# Patient Record
Sex: Male | Born: 1955 | Race: Black or African American | Hispanic: No | Marital: Single | Smoking: Current every day smoker
Health system: Southern US, Community
[De-identification: ages and names within clinical notes are randomized; demographics above are authoritative.]

## PROBLEM LIST (undated history)

## (undated) DIAGNOSIS — I10 Essential (primary) hypertension: Secondary | ICD-10-CM

## (undated) DIAGNOSIS — Z8674 Personal history of sudden cardiac arrest: Secondary | ICD-10-CM

## (undated) DIAGNOSIS — N186 End stage renal disease: Secondary | ICD-10-CM

## (undated) DIAGNOSIS — Z992 Dependence on renal dialysis: Secondary | ICD-10-CM

## (undated) DIAGNOSIS — I509 Heart failure, unspecified: Secondary | ICD-10-CM

## (undated) DIAGNOSIS — I2510 Atherosclerotic heart disease of native coronary artery without angina pectoris: Secondary | ICD-10-CM

## (undated) DIAGNOSIS — N400 Enlarged prostate without lower urinary tract symptoms: Secondary | ICD-10-CM

## (undated) DIAGNOSIS — Z8673 Personal history of transient ischemic attack (TIA), and cerebral infarction without residual deficits: Secondary | ICD-10-CM

## (undated) DIAGNOSIS — I639 Cerebral infarction, unspecified: Secondary | ICD-10-CM

## (undated) DIAGNOSIS — N2581 Secondary hyperparathyroidism of renal origin: Secondary | ICD-10-CM

## (undated) HISTORY — DX: Personal history of transient ischemic attack (TIA), and cerebral infarction without residual deficits: Z86.73

## (undated) HISTORY — DX: Secondary hyperparathyroidism of renal origin: N25.81

## (undated) HISTORY — DX: Atherosclerotic heart disease of native coronary artery without angina pectoris: I25.10

## (undated) HISTORY — DX: Cerebral infarction, unspecified: I63.9

## (undated) HISTORY — DX: Dependence on renal dialysis: N18.6

## (undated) HISTORY — DX: Benign prostatic hyperplasia without lower urinary tract symptoms: N40.0

## (undated) HISTORY — DX: Essential (primary) hypertension: I10

## (undated) HISTORY — DX: Personal history of sudden cardiac arrest: Z86.74

## (undated) HISTORY — DX: Dependence on renal dialysis: Z99.2

## (undated) HISTORY — DX: Heart failure, unspecified: I50.9

---

## 2005-04-25 ENCOUNTER — Other Ambulatory Visit: Payer: Self-pay

## 2006-11-05 ENCOUNTER — Other Ambulatory Visit: Payer: Self-pay

## 2008-02-09 ENCOUNTER — Other Ambulatory Visit: Payer: Self-pay | Admitting: Emergency Medicine

## 2008-08-21 ENCOUNTER — Other Ambulatory Visit: Payer: Self-pay | Admitting: Emergency Medicine

## 2010-01-09 ENCOUNTER — Other Ambulatory Visit: Payer: Self-pay | Admitting: Emergency Medicine

## 2011-03-19 HISTORY — PX: CARDIAC CATHETERIZATION: SHX172

## 2011-05-07 ENCOUNTER — Other Ambulatory Visit: Payer: Self-pay

## 2011-11-26 ENCOUNTER — Other Ambulatory Visit: Payer: Self-pay | Admitting: Internal Medicine

## 2011-11-26 DIAGNOSIS — I469 Cardiac arrest, cause unspecified: Secondary | ICD-10-CM

## 2011-11-27 DIAGNOSIS — I059 Rheumatic mitral valve disease, unspecified: Secondary | ICD-10-CM

## 2011-11-28 DIAGNOSIS — I2510 Atherosclerotic heart disease of native coronary artery without angina pectoris: Secondary | ICD-10-CM

## 2011-12-02 ENCOUNTER — Encounter: Payer: Self-pay | Admitting: Cardiovascular Disease

## 2011-12-04 ENCOUNTER — Encounter: Payer: Self-pay | Admitting: *Deleted

## 2011-12-11 ENCOUNTER — Encounter: Payer: Medicaid Other | Admitting: Cardiovascular Disease

## 2011-12-16 ENCOUNTER — Encounter: Payer: Self-pay | Admitting: Cardiovascular Disease

## 2011-12-19 ENCOUNTER — Other Ambulatory Visit: Payer: Self-pay

## 2011-12-20 ENCOUNTER — Other Ambulatory Visit: Payer: Self-pay | Admitting: Internal Medicine

## 2011-12-20 DIAGNOSIS — R079 Chest pain, unspecified: Secondary | ICD-10-CM

## 2011-12-22 ENCOUNTER — Other Ambulatory Visit: Payer: Self-pay

## 2011-12-23 ENCOUNTER — Telehealth: Payer: Self-pay

## 2011-12-23 NOTE — Telephone Encounter (Signed)
TCM call

## 2011-12-23 NOTE — Telephone Encounter (Signed)
Attempted to reach pt for TCM call #1 No answer Voice mailbox not set up yet Will try again later

## 2011-12-23 NOTE — Telephone Encounter (Signed)
TCM call I called pt to assess how he was feeling since d/c yesterday 12/22/11 He says he feels well despite soreness on bottom of one of his feet.  He denies CP, worsening sob or palpitations. He says his "heart feels fine'. He denies known abrasions or open wounds on bottom of foot. He says he lives at home with his sister and son.  They are both at work right now and unable to speak with me. Steve Ortiz speech is difficult to understand, but he was able to tell me he has only "some" of his meds from discharge.  He is unaware as to which ones he does not have since family places meds in pill box for him. He was unaware of appt scheduled with Dr. Mariah Milling 10/9 at 2:15. He says he is unsure if he can come d/t transportation issues. Says he usually has to use [facility] transportation system and has not scheduled appt yet. I told him I would call for him to have them pick him up for Wednesday's appt. I explained importance of coming to this appt since he may not be on all meds he needs, etc. He verb. Understanding and will come Wednesday 10/9.

## 2011-12-23 NOTE — Telephone Encounter (Signed)
I was able to call [facility] public transportation services and schedule ride for pt Pt was notified and verb. Understanding I attempted to reach pt's sister at # provided in hosp records. This # was disconnected. There are no other #s on file for pt.

## 2011-12-23 NOTE — Telephone Encounter (Signed)
Message copied by Marcelle Overlie on Mon Dec 23, 2011  8:40 AM ------      Message from: Oneida Arenas      Created: Mon Dec 23, 2011  8:36 AM      Regarding: TCM       Pt was discharged on Sunday

## 2011-12-25 ENCOUNTER — Encounter: Payer: Self-pay | Admitting: Cardiovascular Disease

## 2011-12-25 ENCOUNTER — Ambulatory Visit (INDEPENDENT_AMBULATORY_CARE_PROVIDER_SITE_OTHER): Payer: Medicaid Other | Admitting: Cardiovascular Disease

## 2011-12-25 VITALS — BP 148/80 | HR 96 | Ht 68.0 in | Wt 152.2 lb

## 2011-12-25 DIAGNOSIS — I2510 Atherosclerotic heart disease of native coronary artery without angina pectoris: Secondary | ICD-10-CM

## 2011-12-25 DIAGNOSIS — R0602 Shortness of breath: Secondary | ICD-10-CM | POA: Insufficient documentation

## 2011-12-25 DIAGNOSIS — R079 Chest pain, unspecified: Secondary | ICD-10-CM

## 2011-12-25 DIAGNOSIS — F17200 Nicotine dependence, unspecified, uncomplicated: Secondary | ICD-10-CM

## 2011-12-25 DIAGNOSIS — I471 Supraventricular tachycardia: Secondary | ICD-10-CM

## 2011-12-25 DIAGNOSIS — E785 Hyperlipidemia, unspecified: Secondary | ICD-10-CM | POA: Insufficient documentation

## 2011-12-25 NOTE — Assessment & Plan Note (Signed)
Severe disease as detailed. If he has any worsening chest pain, he may require interventional cardiology to place a flow wire to his LAD to determine if intervention is needed.

## 2011-12-25 NOTE — Patient Instructions (Addendum)
You are doing well. Please continue amiodarone 200 mg twice a day for one more week (until 01/01/2012) Then decrease the dose of amiodarone to one a day Don't forget to take metoprolol twice a day for fast heart rate  Please call us if you have new issues that need to be addressed before your next appt.  Your physician wants you to follow-up in: 3 months.  You will receive a reminder letter in the mail two months in advance. If you don't receive a letter, please call our office to schedule the follow-up appointment.

## 2011-12-25 NOTE — Assessment & Plan Note (Signed)
We have recommended smoking cessation. We have counseled him on the significance, worsening heart disease, COPD.

## 2011-12-25 NOTE — Progress Notes (Signed)
Patient ID: Steve Ortiz, male    DOB: Jun 07, 1955, 56 y.o.   MRN: [id number]  HPI Comments: [name] is a 56 year old African American gentleman with end-stage renal disease on dialysis 3 days per week, coronary artery disease, COPD , CVA with residual right-sided weakness, history of tobacco and alcohol, cocaine, diet controlled diabetes, hypertension with numerous admissions sent from dialysis for atypical chest pain, recent cardiac catheterization showing moderate LAD disease, occluded left circumflex in the mid region, occluded RCA in the proximal region. LAD disease estimated at 50-60%. He presents after recent discharge from the hospital December 22 2011.  On his most recent admission at the beginning of October, he had atypical right-sided chest pain, was found to have episodes of SVT and started on amiodarone load, changed to 200 mg twice a day at discharge with improvement of his arrhythmia and symptoms. Unable to exclude pericarditis.  Echocardiogram on recent admission showed ejection fraction 25-30%.   He was also admitted September 10 with cardiac arrest requiring CPR, defibrillated x1 with restoration of his rhythm. Potassium found to be 2.9. He was sent from dialysis. Elevated cardiac enzymes was felt secondary to ICD shock. No significant arrhythmia noted at that time. Cardiac catheterization was performed as part of his workup showing occluded left circumflex, RCA with patent LAD with moderate disease. Vessel was evaluated by interventional cardiology and medical management was recommended.  He presents today and reports that he feels well with no symptoms of palpitations, tachycardia or shortness of breath. No further chest pain. He seems to manage most of his medications at home by himself. Compliance is uncertain. He is a poor historian.  EKG shows normal sinus rhythm with rate 97 beats per minute with PVCs, lateral infarct, inferior infarct   Outpatient Encounter Prescriptions  as of 12/25/2011  Medication Sig Dispense Refill  . amiodarone (PACERONE) 200 MG tablet Take 200 mg by mouth 2 (two) times daily.      Marland Kitchen amLODipine (NORVASC) 5 MG tablet Take 5 mg by mouth daily.      Marland Kitchen atorvastatin (LIPITOR) 20 MG tablet Take 20 mg by mouth daily.      . calcium acetate (PHOSLO) 667 MG capsule 2 capsules three times daily.      . colchicine 0.6 MG tablet Take 0.6 mg by mouth 2 (two) times daily.      Marland Kitchen dipyridamole-aspirin (AGGRENOX) 200-25 MG per 12 hr capsule Take 1 capsule by mouth 2 (two) times daily.      . finasteride (PROSCAR) 5 MG tablet Take 5 mg by mouth daily.      . furosemide (LASIX) 80 MG tablet Take 80 mg by mouth daily.      Marland Kitchen glipiZIDE (GLUCOTROL) 5 MG tablet Take 5 mg by mouth daily.      Marland Kitchen HYDROcodone-acetaminophen (NORCO/VICODIN) 5-325 MG per tablet Take 1 tablet by mouth every 6 (six) hours as needed.      . isosorbide mononitrate (IMDUR) 30 MG 24 hr tablet Take 30 mg by mouth daily.      Marland Kitchen levofloxacin (LEVAQUIN) 500 MG tablet Take 500 mg by mouth every other day.      . metoprolol (LOPRESSOR) 50 MG tablet Take 50 mg by mouth 2 (two) times daily.      . sodium bicarbonate 650 MG tablet Take 650 mg by mouth 2 (two) times daily.      Marland Kitchen tiZANidine (ZANAFLEX) 4 MG capsule Take 4 mg by mouth at bedtime as needed.  Review of Systems  Constitutional: Negative.   HENT: Negative.   Eyes: Negative.   Respiratory: Negative.   Cardiovascular: Negative.   Gastrointestinal: Negative.   Musculoskeletal: Negative.   Skin: Negative.   Neurological: Negative.   Hematological: Negative.   Psychiatric/Behavioral: Negative.   All other systems reviewed and are negative.    BP 148/80  Pulse 96  Ht 5\' 8"  (1.727 m)  Wt 152 lb 4 oz (69.06 kg)  BMI 23.15 kg/m2  Physical Exam  Nursing note and vitals reviewed. Constitutional: He is oriented to person, place, and time. He appears well-developed and well-nourished.  HENT:  Head: Normocephalic.  Nose: Nose  normal.  Mouth/Throat: Oropharynx is clear and moist.  Eyes: Conjunctivae normal are normal. Pupils are equal, round, and reactive to light.  Neck: Normal range of motion. Neck supple. No JVD present.  Cardiovascular: Normal rate, regular rhythm, S1 normal, S2 normal, normal heart sounds and intact distal pulses.  Exam reveals no gallop and no friction rub.   No murmur heard. Pulses:      Carotid pulses are 1+ on the right side, and 1+ on the left side.      Radial pulses are 1+ on the right side, and 1+ on the left side.       Dorsalis pedis pulses are 1+ on the right side, and 1+ on the left side.       Posterior tibial pulses are 1+ on the right side, and 1+ on the left side.  Pulmonary/Chest: Effort normal. No respiratory distress. He has decreased breath sounds. He has no wheezes. He has no rales. He exhibits no tenderness.  Abdominal: Soft. Bowel sounds are normal. He exhibits no distension. There is no tenderness.  Musculoskeletal: Normal range of motion. He exhibits no edema and no tenderness.  Lymphadenopathy:    He has no cervical adenopathy.  Neurological: He is alert and oriented to person, place, and time. Coordination normal.  Skin: Skin is warm and dry. No rash noted. No erythema.  Psychiatric: He has a normal mood and affect. His behavior is normal. Judgment and thought content normal.           Assessment and Plan

## 2011-12-25 NOTE — Assessment & Plan Note (Addendum)
We have suggested he continue amiodarone 200 mg twice a day, decreasing to 200 mg daily next week. We have strongly recommended he take his metoprolol twice a day.

## 2011-12-25 NOTE — Assessment & Plan Note (Signed)
Recent atypical chest pain but symptoms on the right. These have resolved. No further workup at this time.

## 2011-12-25 NOTE — Assessment & Plan Note (Signed)
We have recommended he continue on his Lipitor daily. Goal LDL less than 70

## 2011-12-25 NOTE — Telephone Encounter (Signed)
Pt here for appt.

## 2012-01-06 ENCOUNTER — Encounter: Payer: Self-pay | Admitting: Cardiovascular Disease

## 2012-01-21 DIAGNOSIS — F17200 Nicotine dependence, unspecified, uncomplicated: Secondary | ICD-10-CM

## 2012-01-21 DIAGNOSIS — R0602 Shortness of breath: Secondary | ICD-10-CM

## 2012-01-21 DIAGNOSIS — I498 Other specified cardiac arrhythmias: Secondary | ICD-10-CM

## 2012-01-21 DIAGNOSIS — E785 Hyperlipidemia, unspecified: Secondary | ICD-10-CM

## 2012-01-21 DIAGNOSIS — R079 Chest pain, unspecified: Secondary | ICD-10-CM

## 2012-01-21 DIAGNOSIS — I2510 Atherosclerotic heart disease of native coronary artery without angina pectoris: Secondary | ICD-10-CM

## 2012-02-03 ENCOUNTER — Encounter: Payer: Self-pay | Admitting: Cardiovascular Disease

## 2012-03-27 ENCOUNTER — Ambulatory Visit: Payer: Medicaid Other | Admitting: Cardiovascular Disease

## 2012-03-27 ENCOUNTER — Encounter: Payer: Self-pay | Admitting: *Deleted

## 2012-04-23 ENCOUNTER — Other Ambulatory Visit: Payer: Self-pay | Admitting: Internal Medicine

## 2012-04-24 ENCOUNTER — Other Ambulatory Visit: Payer: Self-pay | Admitting: Internal Medicine

## 2012-05-26 ENCOUNTER — Other Ambulatory Visit: Payer: Self-pay | Admitting: Emergency Medicine

## 2012-11-28 ENCOUNTER — Other Ambulatory Visit: Payer: Self-pay | Admitting: Emergency Medicine

## 2012-12-10 ENCOUNTER — Other Ambulatory Visit: Payer: Self-pay | Admitting: Emergency Medicine

## 2013-02-24 IMAGING — XA IR VASCULAR PROCEDURE
3 series · 6 of 6 positions shown · non-contrast
Comparison: none

[Series 1: care upper arm · 3 of 3 slices shown (1 of 2)]
[im 1/3]
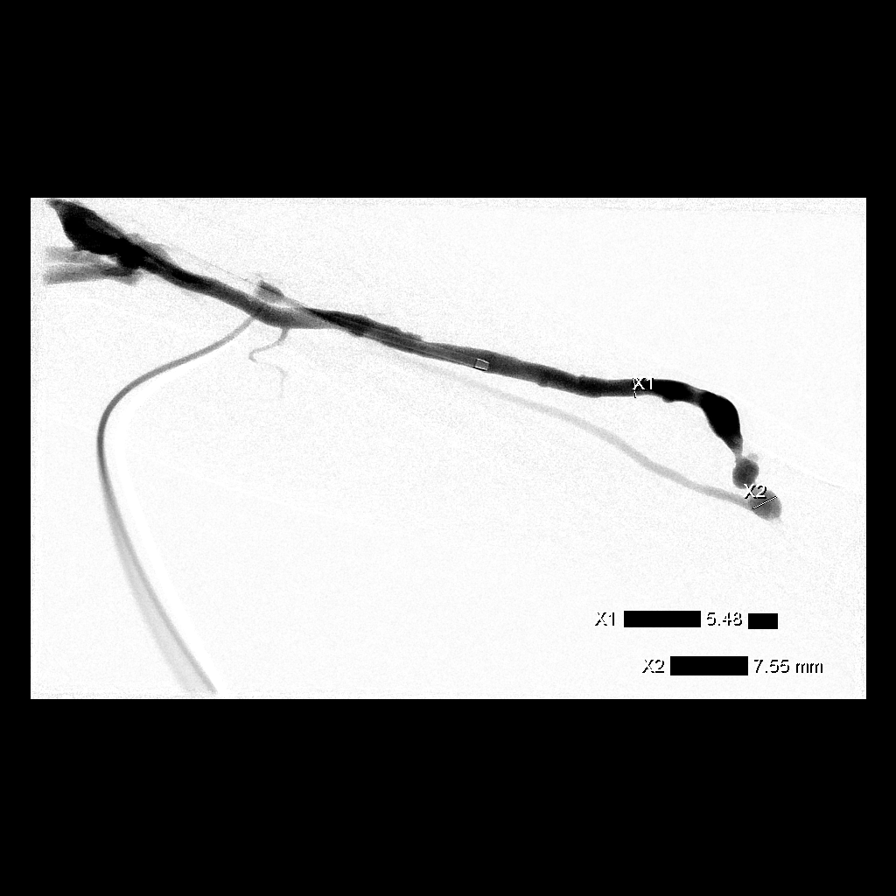
[im 2/3]
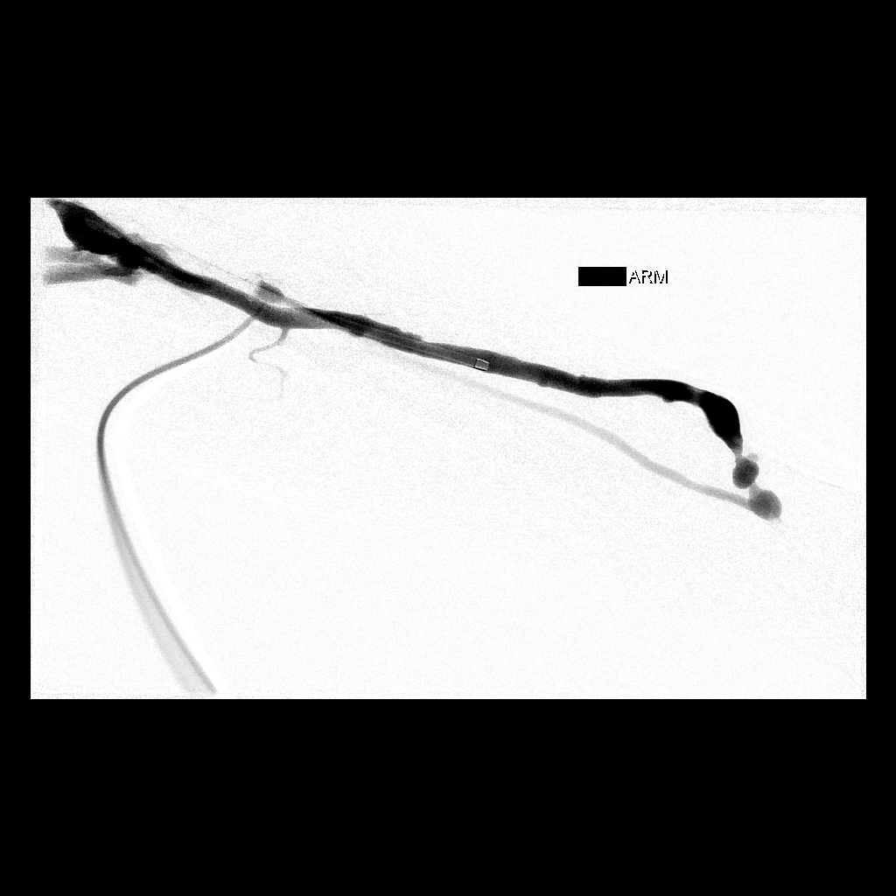
[im 3/3]
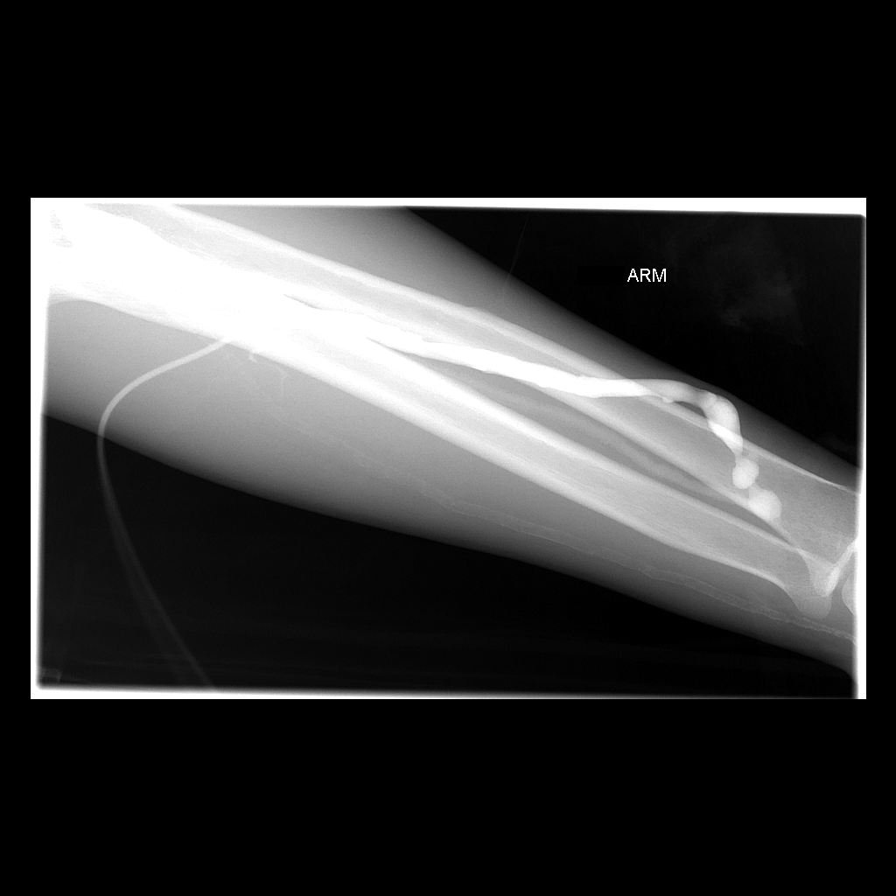

[Series 2: fl - angio · 1 of 1 slices shown]
[im 1/1]
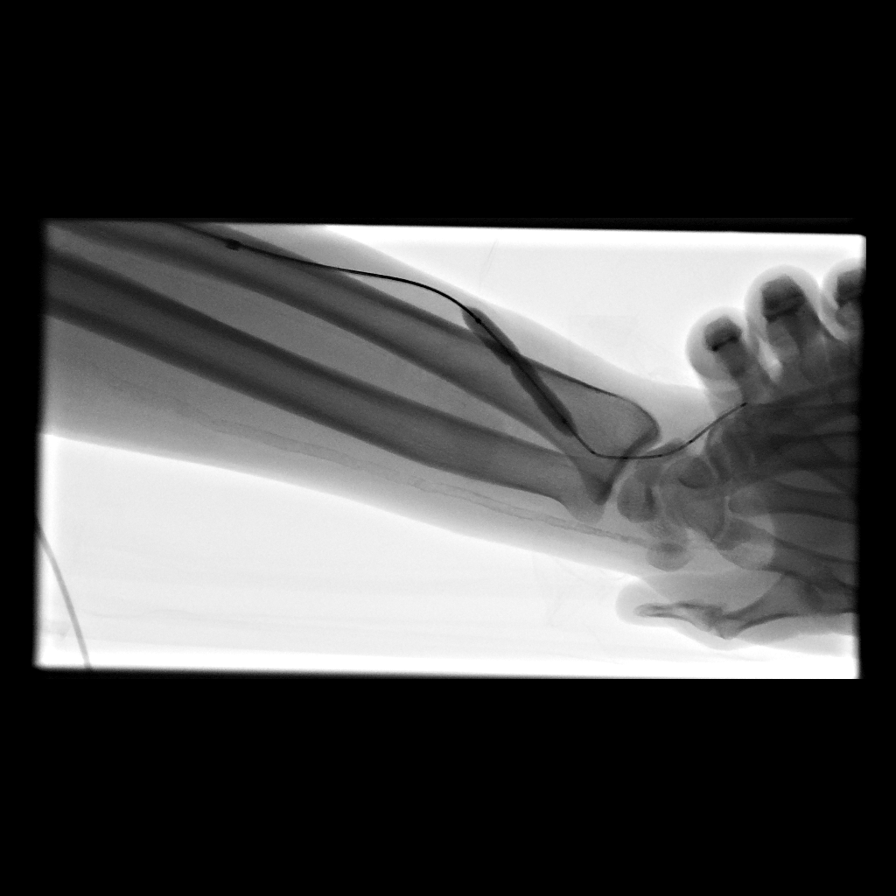

[Series 3: care upper arm · 2 of 2 slices shown (2 of 2)]
[im 1/2]
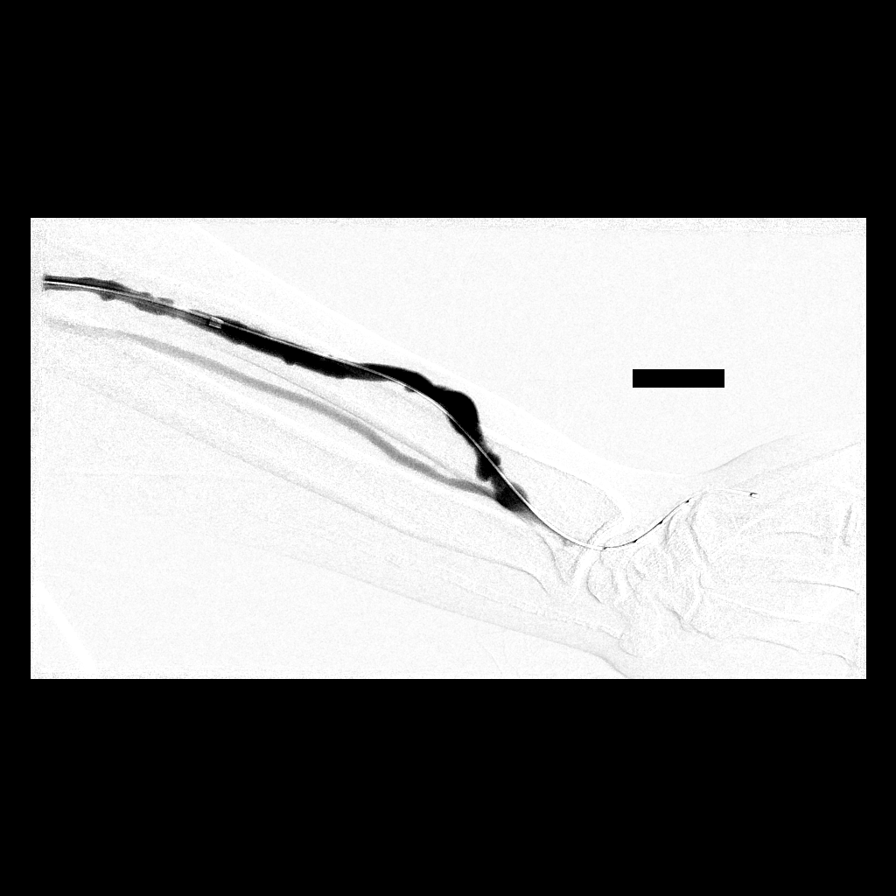
[im 2/2]
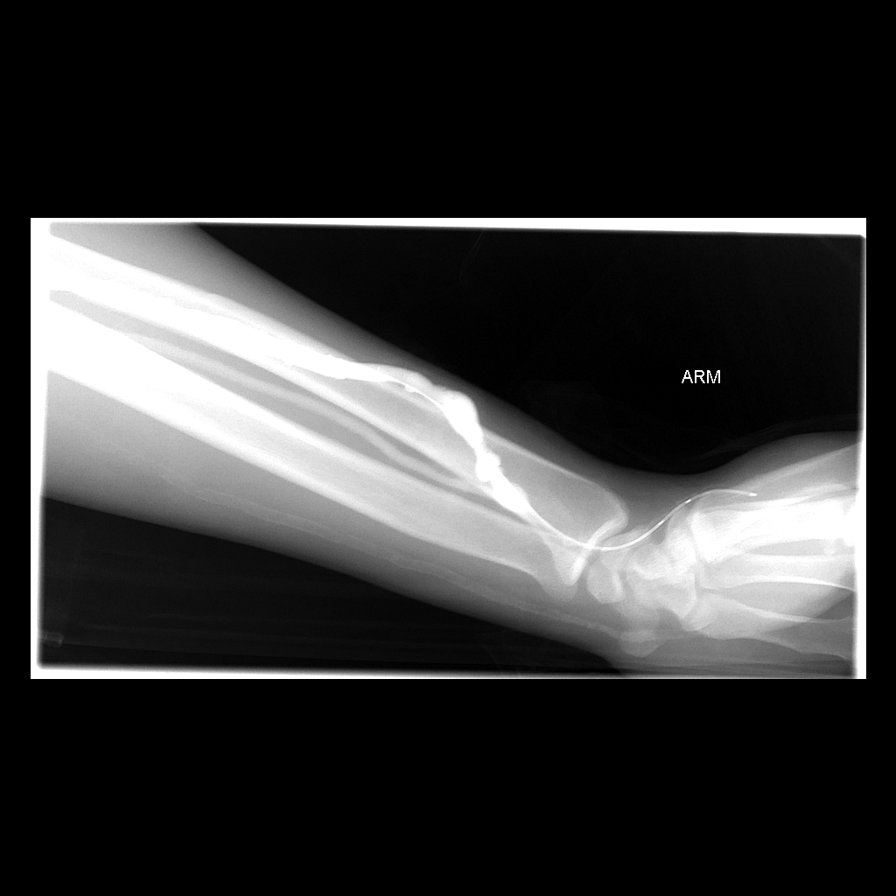

[6 of 6 positions shown; findings below may reference images not displayed]

IMAGES IMPORTED FROM THE [employer] WORKFLOW SYSTEM
NO DICTATION FOR STUDY

## 2013-04-14 ENCOUNTER — Other Ambulatory Visit: Payer: Self-pay | Admitting: Cardiothoracic Surgery

## 2013-04-23 ENCOUNTER — Ambulatory Visit
Admit: 2013-04-23 | Disposition: A | Discharge status: Home / Self Care | Payer: Self-pay | Admitting: Cardiothoracic Surgery

## 2013-06-23 ENCOUNTER — Ambulatory Visit: Payer: Self-pay | Admitting: Podiatry

## 2013-06-28 ENCOUNTER — Ambulatory Visit (INDEPENDENT_AMBULATORY_CARE_PROVIDER_SITE_OTHER): Payer: Medicaid Other | Admitting: Podiatry

## 2013-06-28 ENCOUNTER — Ambulatory Visit (INDEPENDENT_AMBULATORY_CARE_PROVIDER_SITE_OTHER): Payer: Medicaid Other

## 2013-06-28 ENCOUNTER — Encounter: Payer: Self-pay | Admitting: Podiatry

## 2013-06-28 VITALS — Resp 16 | Ht 64.0 in | Wt 135.0 lb

## 2013-06-28 DIAGNOSIS — M79609 Pain in unspecified limb: Secondary | ICD-10-CM

## 2013-06-28 DIAGNOSIS — M79673 Pain in unspecified foot: Secondary | ICD-10-CM

## 2013-06-28 DIAGNOSIS — B351 Tinea unguium: Secondary | ICD-10-CM

## 2013-06-28 DIAGNOSIS — E1149 Type 2 diabetes mellitus with other diabetic neurological complication: Secondary | ICD-10-CM

## 2013-06-28 DIAGNOSIS — E1159 Type 2 diabetes mellitus with other circulatory complications: Secondary | ICD-10-CM

## 2013-06-28 DIAGNOSIS — Q828 Other specified congenital malformations of skin: Secondary | ICD-10-CM

## 2013-06-28 NOTE — Progress Notes (Signed)
   Subjective:    Patient ID: Steve Ortiz, male    DOB: [birth year], 58 y.o.   MRN: [id number]  HPI Comments: i need my toenails trimmed. They do hurt me. They've been like this for years and getting worse. i dont do anything for my toenails. i went to a doctor in the past for my nails but i havent been there in a while.     Review of Systems  Constitutional: Negative.   HENT: Negative.   Eyes: Positive for visual disturbance.  Respiratory: Positive for cough, shortness of breath and wheezing.        Difficulty breathing  Cardiovascular:       Calf pain with walking  Gastrointestinal: Negative.   Endocrine: Positive for cold intolerance.  Genitourinary: Positive for difficulty urinating.  Musculoskeletal:       Difficulty walking  Skin: Negative.   Allergic/Immunologic: Negative.   Neurological: Positive for light-headedness.  Hematological: Negative.   Psychiatric/Behavioral: Negative.        Objective:   Physical Exam: I have reviewed his past medical history medications allergies surgeries social history. Pulses are nonpalpable bilateral feet are warm to touch and venous distention is noted. Neurologic sensorium is decreased per Semmes-Weinstein monofilament and deep tendon reflexes are in non-elicitable. Muscle strength is +4/5 dorsiflexors plantar flexors inverters and evertors. Orthopedic evaluation demonstrates pes planus with hammertoe deformities. Cutaneous evaluation demonstrates supple well hydrated cutis dorsally with dry xerotic skin plantarly. Nails are thick yellow dystrophic clinically mycotic. They're also painful on palpation as well as debridement.        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 Painfully elongated toenails with onychomycosis and pain in limb.  Plan: Discussed etiology pathology conservative versus surgical therapies. I dispensed a pair of our staff insoles as a courtesy and debridement  nails 1 through 5 bilateral. I will followup with him in 3 months.

## 2013-07-19 ENCOUNTER — Other Ambulatory Visit: Payer: Self-pay | Admitting: Emergency Medicine

## 2013-07-20 DIAGNOSIS — R748 Abnormal levels of other serum enzymes: Secondary | ICD-10-CM

## 2013-07-20 DIAGNOSIS — I5023 Acute on chronic systolic (congestive) heart failure: Secondary | ICD-10-CM

## 2013-07-21 DIAGNOSIS — I059 Rheumatic mitral valve disease, unspecified: Secondary | ICD-10-CM

## 2013-08-09 ENCOUNTER — Other Ambulatory Visit: Payer: Self-pay | Admitting: Emergency Medicine

## 2013-08-18 ENCOUNTER — Other Ambulatory Visit: Payer: Self-pay | Admitting: Emergency Medicine

## 2013-08-30 ENCOUNTER — Other Ambulatory Visit: Payer: Self-pay

## 2013-09-21 ENCOUNTER — Other Ambulatory Visit: Payer: Self-pay | Admitting: Emergency Medicine

## 2013-09-27 ENCOUNTER — Ambulatory Visit: Payer: Medicaid Other | Admitting: Podiatry

## 2013-10-16 DEATH — deceased

## 2015-06-03 IMAGING — CR DG CHEST 1V PORT
1 series · 1 of 1 positions shown · non-contrast
Comparison: none

REASON FOR EXAM: Altered Mental Status
COMMENTS:

PROCEDURE:     DXR - DXR PORTABLE CHEST SINGLE VIEW  - November 28, 2012 [DATE]
RESULT:     Comparison is made to prior study dated 08/14/2012.

[ap]
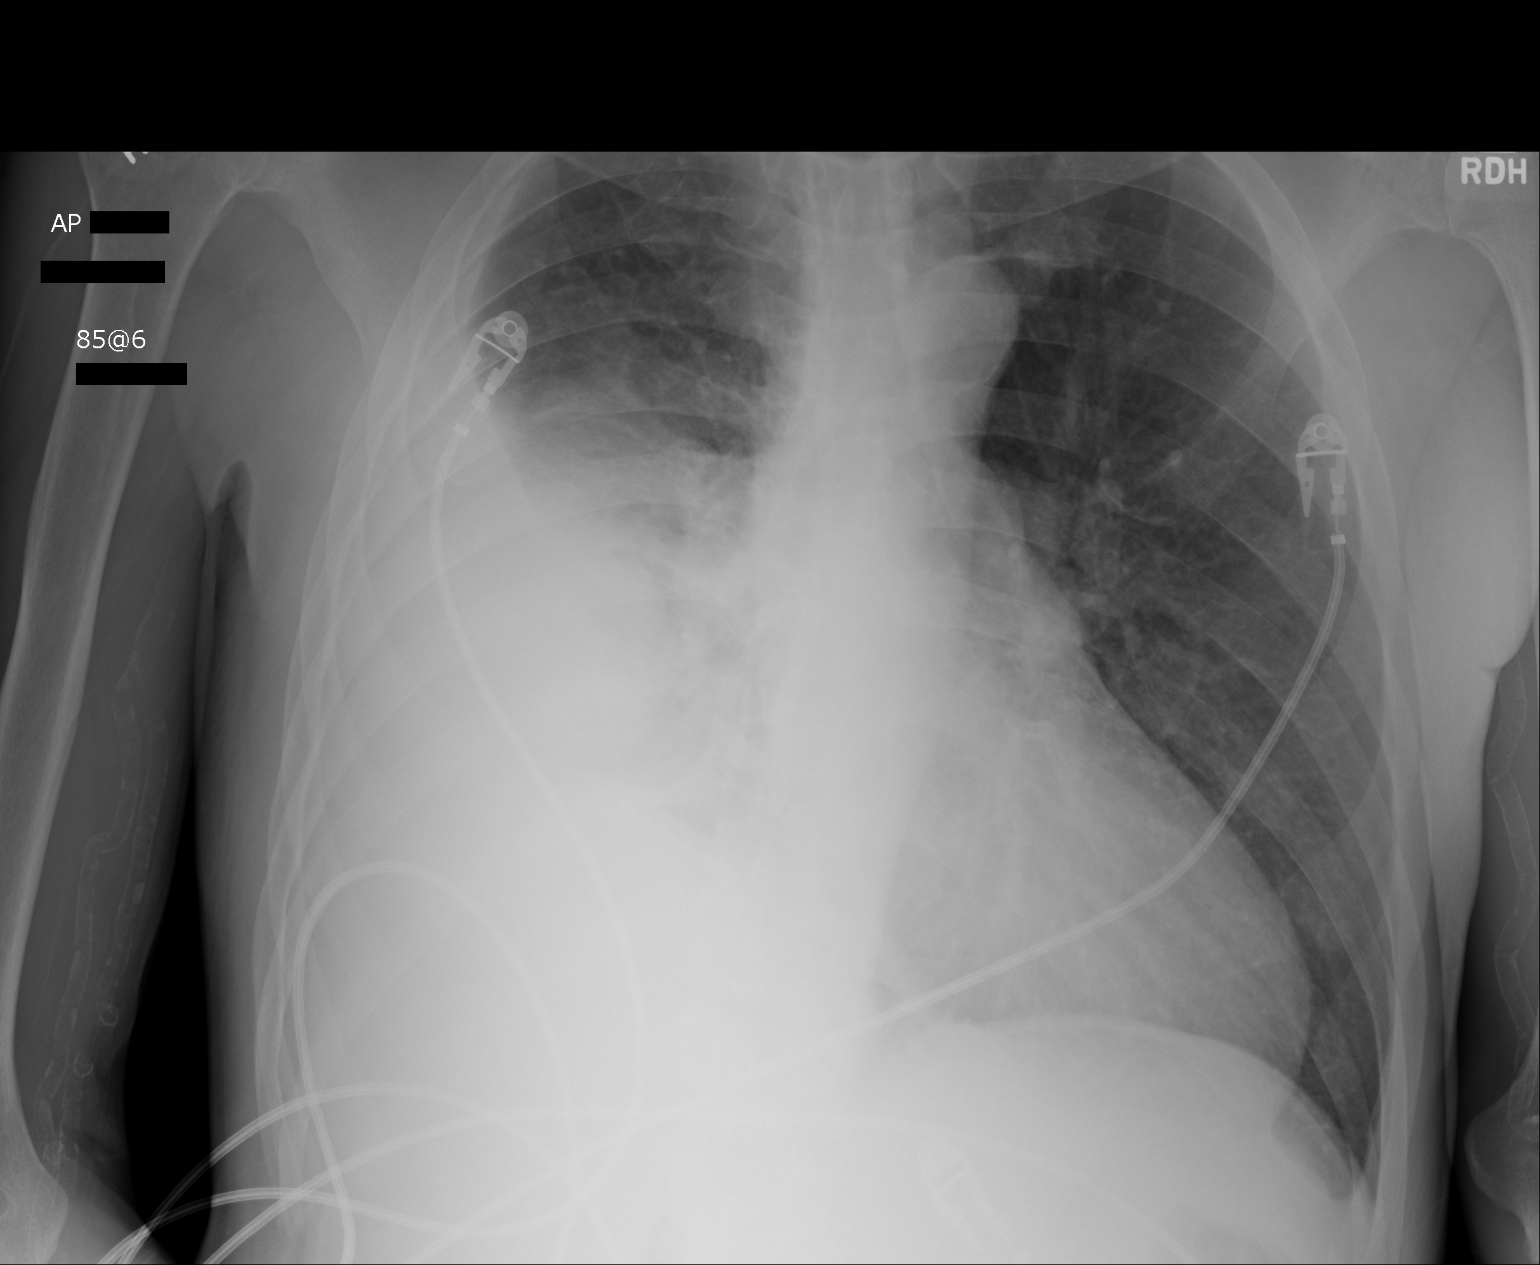

[1 of 1 positions shown; findings below may reference images not displayed]

FINDINGS: Persistent diffuse increased density projects in the right
hemithorax nearly encompassing approximately two thirds of the right
hemithorax. No new focal regions of consolidation or new focal infiltrates
are identified. The cardiac silhouette and visualized bony skeleton are
unremarkable.
IMPRESSION: 1. Stable findings within the right hemithorax as described above without
evidence of acute cardiopulmonary disease.

## 2015-06-06 IMAGING — CR DG CHEST 2V
1 series · 2 of 2 positions shown · non-contrast
Comparison: none

REASON FOR EXAM: cough
COMMENTS:

[Series 1: w chest pa · 0.14mm/px · 2 of 2 slices shown]
[im 1/2]
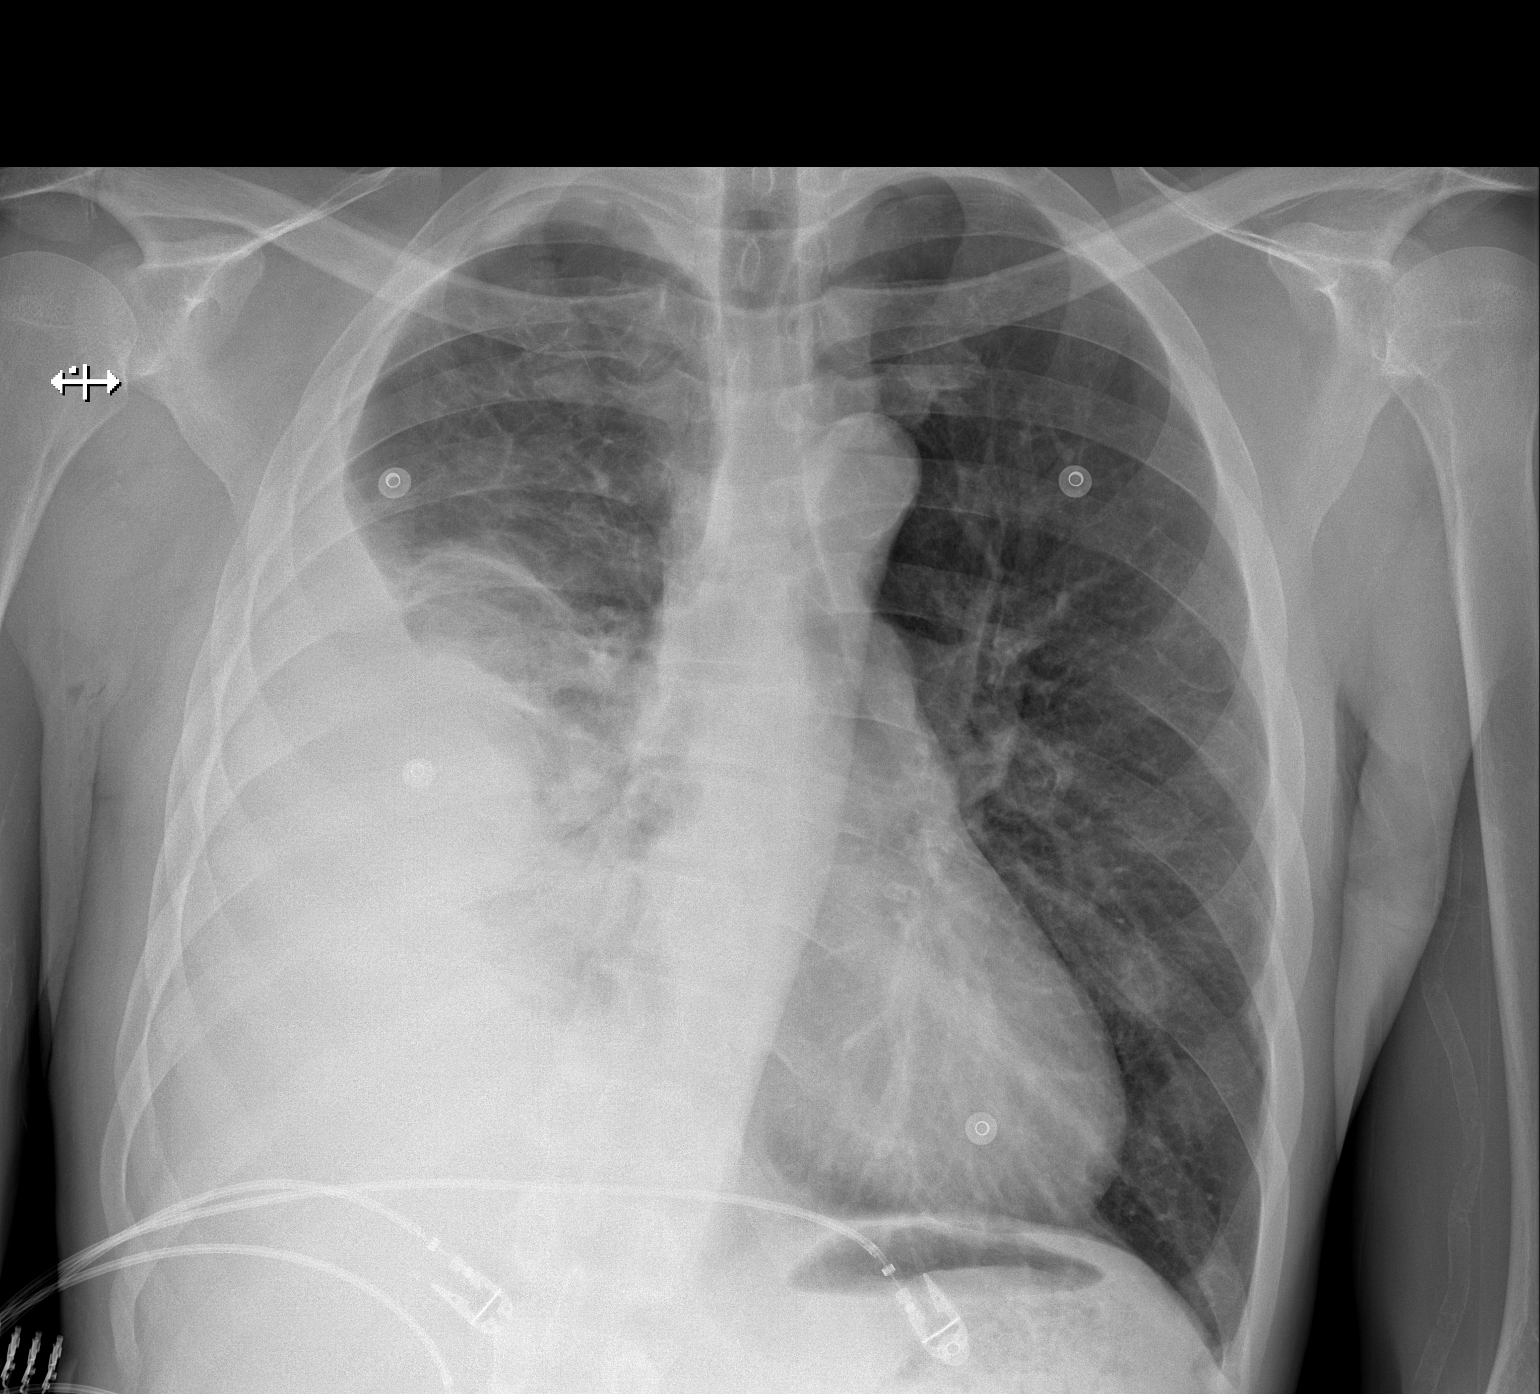
[im 2/2]
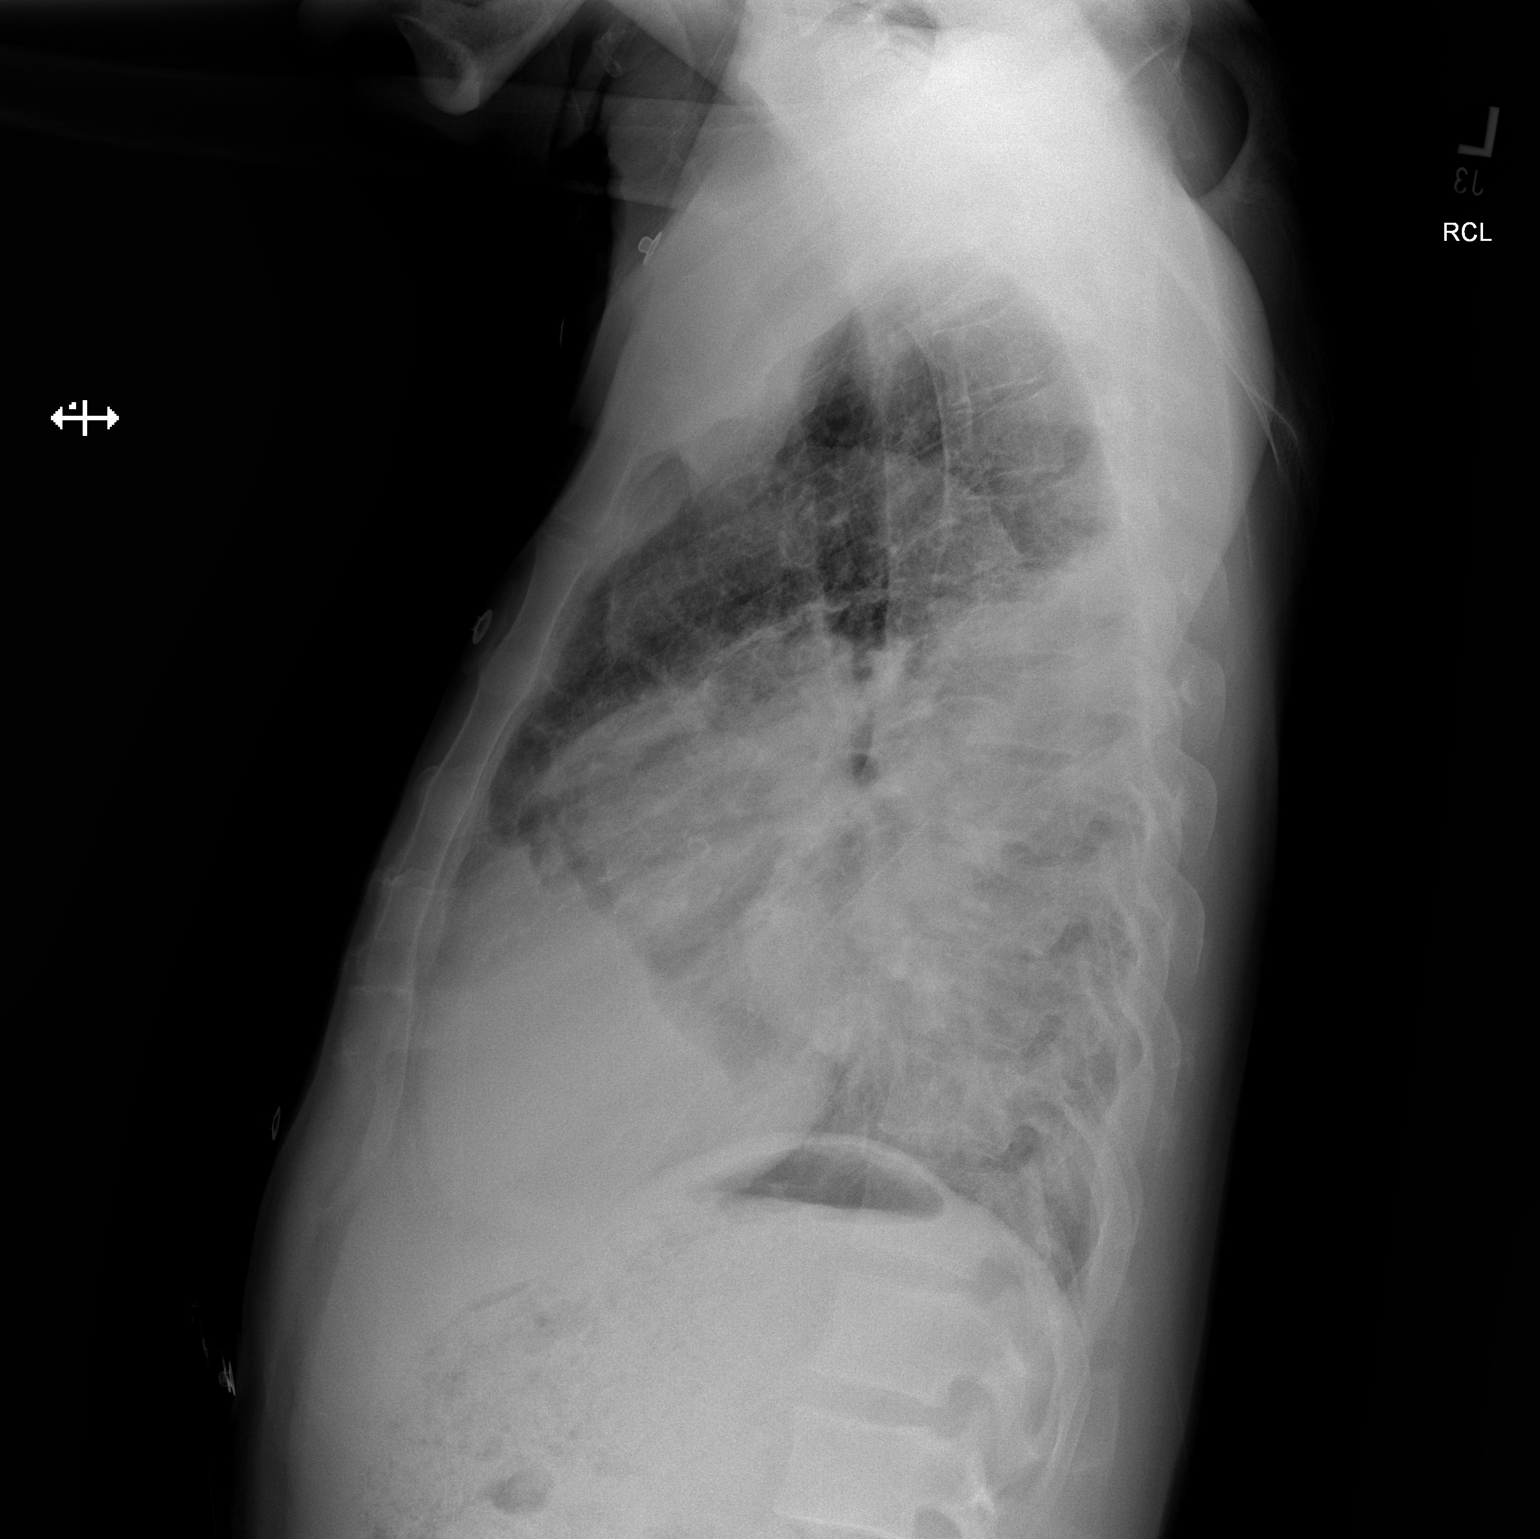

[2 of 2 positions shown; findings below may reference images not displayed]

PROCEDURE:     DXR - DXR CHEST PA (OR AP) AND LATERAL  - December 01, 2012  [DATE]

RESULT:     Comparison is made to previous study of 11/28/2012. There is
persistent increased density in the lower half of the right hemithorax. The
left lung is grossly clear. Prominent atherosclerotic calcification is
present. Moderately large right pleural effusion persists with underlying
atelectasis or infiltrate likely present.
IMPRESSION: Persistent significant opacification in the right
hemithorax likely secondary to large effusion.

[REDACTED]

## 2016-02-23 IMAGING — CR DG CHEST 2V
1 series · 2 of 2 positions shown · non-contrast
Comparison: 08/19/2013

CLINICAL DATA: Right-sided pleural effusion

EXAM:
CHEST  2 VIEW

[Series 2: x chest ap · 0.14mm/px · 2 of 2 slices shown]
[im 1/2]
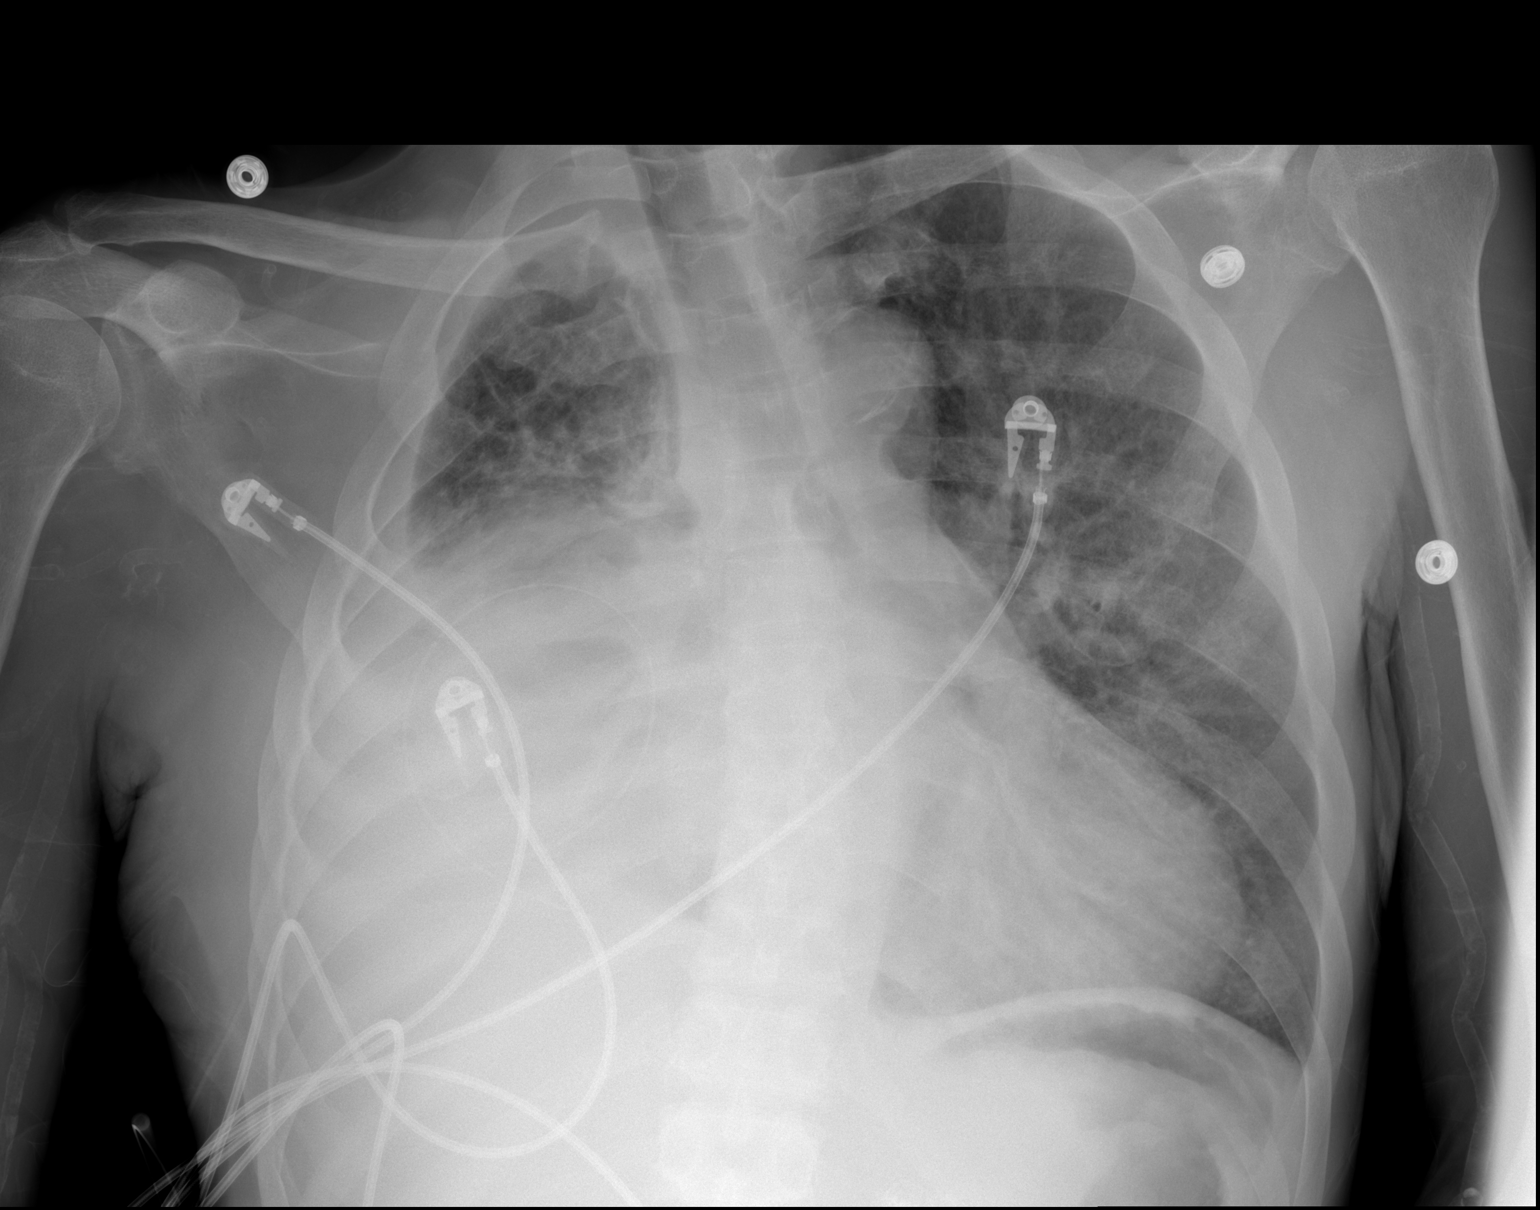
[im 2/2]
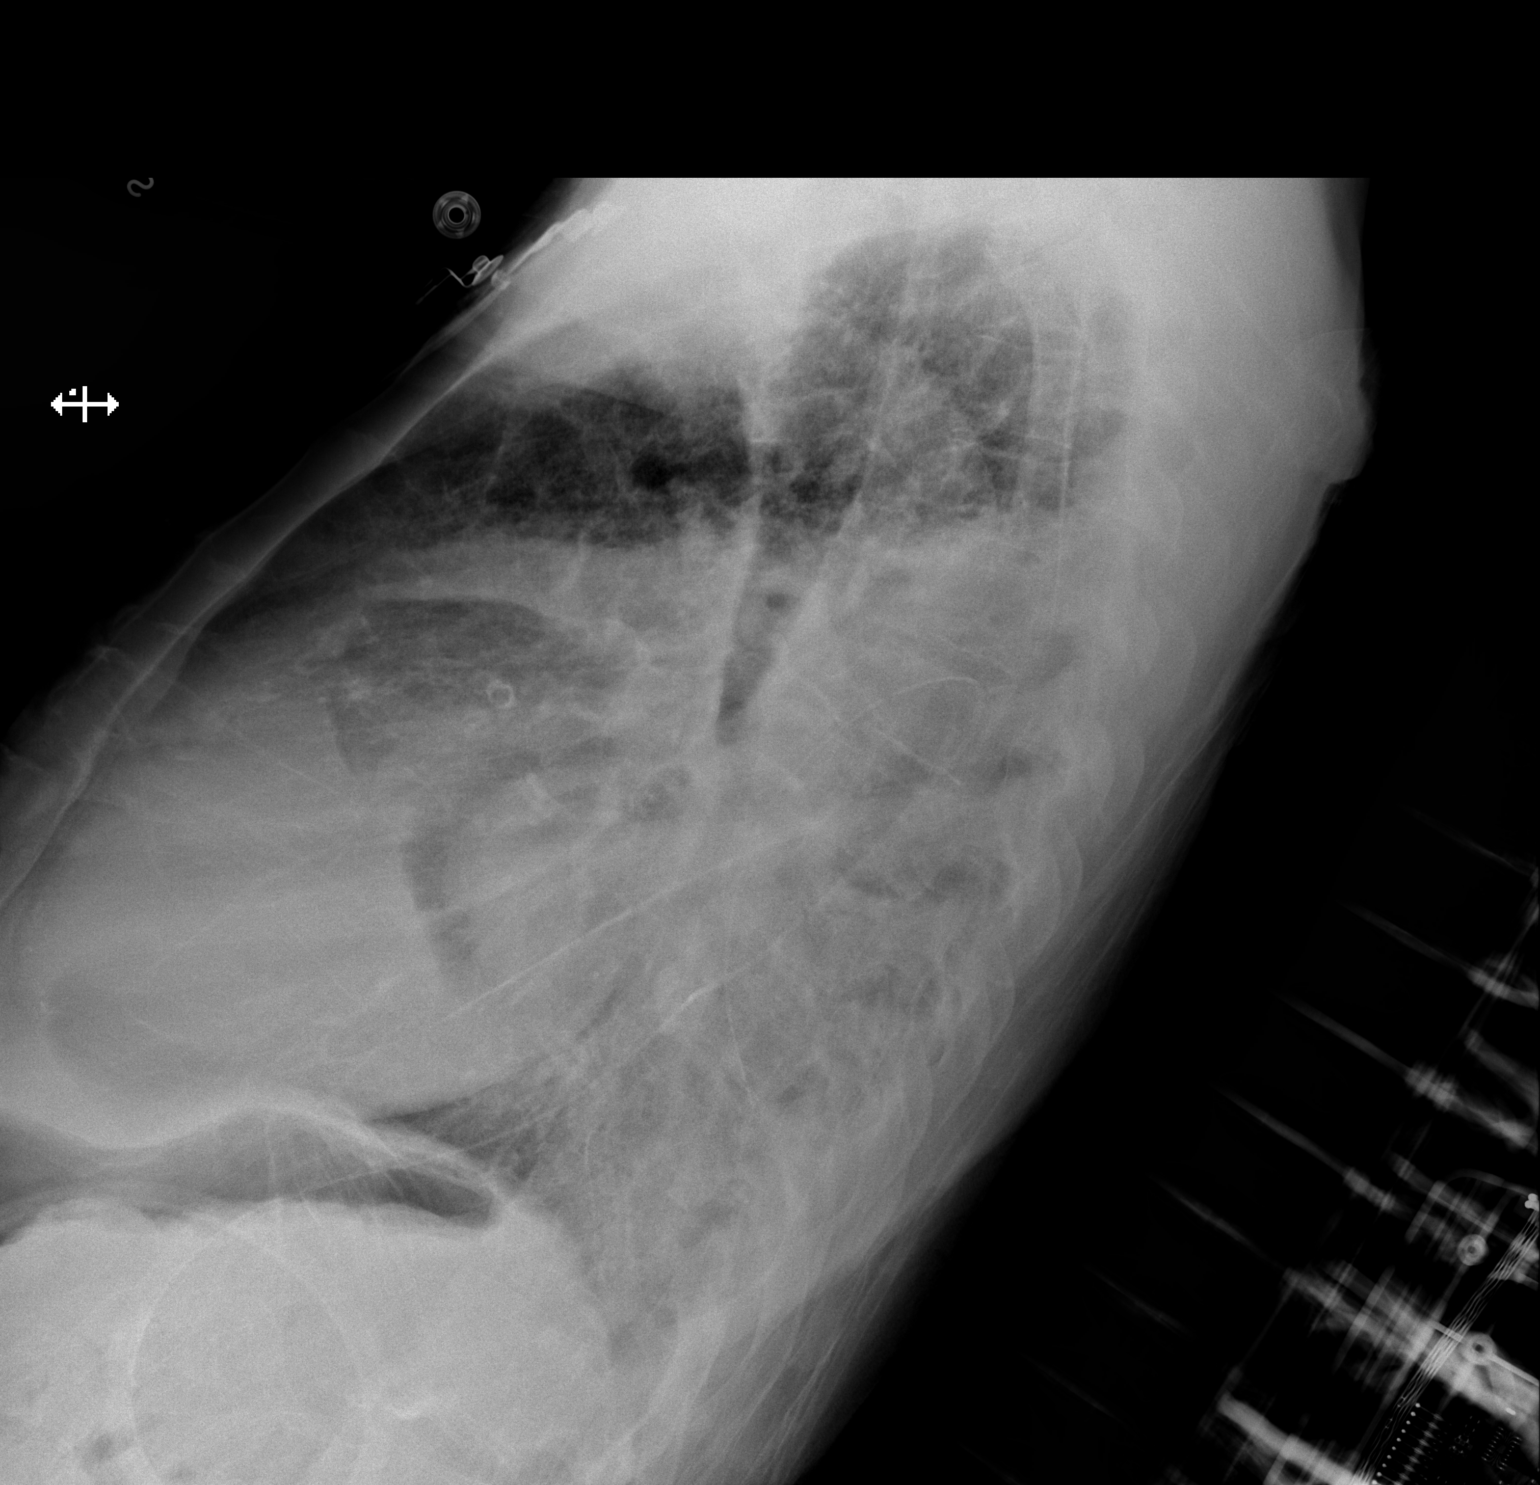

[2 of 2 positions shown; findings below may reference images not displayed]

FINDINGS: The cardiac shadow is stable. The left lung is clear. Persistent
consolidation and effusion is noted on the right. A pleural drainage
catheter is seen. No pneumothorax is noted. The osseous structures
are within normal limits.
IMPRESSION: Stable changes in the right chest.
